# Patient Record
Sex: Male | Born: 1970 | Race: White | Hispanic: No | Marital: Single | Smoking: Never smoker
Health system: Southern US, Community
[De-identification: ages and names within clinical notes are randomized; demographics above are authoritative.]

## PROBLEM LIST (undated history)

## (undated) DIAGNOSIS — E538 Deficiency of other specified B group vitamins: Secondary | ICD-10-CM

## (undated) DIAGNOSIS — N200 Calculus of kidney: Secondary | ICD-10-CM

## (undated) DIAGNOSIS — D61818 Other pancytopenia: Secondary | ICD-10-CM

## (undated) HISTORY — PX: WISDOM TOOTH EXTRACTION: SHX21

## (undated) HISTORY — PX: KIDNEY SURGERY: SHX687

---

## 2015-08-05 ENCOUNTER — Emergency Department (HOSPITAL_COMMUNITY): Payer: Self-pay

## 2015-08-05 ENCOUNTER — Inpatient Hospital Stay (HOSPITAL_COMMUNITY)
Admission: EM | Admit: 2015-08-05 | Discharge: 2015-08-06 | DRG: 809 | Disposition: A | Discharge status: Home / Self Care | Payer: Self-pay | Attending: Internal Medicine | Admitting: Internal Medicine

## 2015-08-05 ENCOUNTER — Encounter (HOSPITAL_COMMUNITY): Payer: Self-pay | Admitting: Emergency Medicine

## 2015-08-05 ENCOUNTER — Inpatient Hospital Stay (HOSPITAL_COMMUNITY): Payer: Self-pay

## 2015-08-05 ENCOUNTER — Other Ambulatory Visit: Payer: Self-pay | Admitting: Oncology

## 2015-08-05 DIAGNOSIS — E538 Deficiency of other specified B group vitamins: Secondary | ICD-10-CM | POA: Diagnosis present

## 2015-08-05 DIAGNOSIS — Z9181 History of falling: Secondary | ICD-10-CM

## 2015-08-05 DIAGNOSIS — R5383 Other fatigue: Secondary | ICD-10-CM | POA: Diagnosis present

## 2015-08-05 DIAGNOSIS — D649 Anemia, unspecified: Secondary | ICD-10-CM | POA: Diagnosis present

## 2015-08-05 DIAGNOSIS — R7989 Other specified abnormal findings of blood chemistry: Secondary | ICD-10-CM | POA: Diagnosis present

## 2015-08-05 DIAGNOSIS — R945 Abnormal results of liver function studies: Secondary | ICD-10-CM | POA: Diagnosis present

## 2015-08-05 DIAGNOSIS — Z87442 Personal history of urinary calculi: Secondary | ICD-10-CM

## 2015-08-05 DIAGNOSIS — E876 Hypokalemia: Secondary | ICD-10-CM | POA: Diagnosis present

## 2015-08-05 DIAGNOSIS — D61818 Other pancytopenia: Principal | ICD-10-CM | POA: Diagnosis present

## 2015-08-05 DIAGNOSIS — R17 Unspecified jaundice: Secondary | ICD-10-CM | POA: Diagnosis present

## 2015-08-05 DIAGNOSIS — D510 Vitamin B12 deficiency anemia due to intrinsic factor deficiency: Secondary | ICD-10-CM | POA: Diagnosis present

## 2015-08-05 DIAGNOSIS — R748 Abnormal levels of other serum enzymes: Secondary | ICD-10-CM | POA: Diagnosis present

## 2015-08-05 DIAGNOSIS — Z833 Family history of diabetes mellitus: Secondary | ICD-10-CM

## 2015-08-05 DIAGNOSIS — D72819 Decreased white blood cell count, unspecified: Secondary | ICD-10-CM

## 2015-08-05 DIAGNOSIS — R531 Weakness: Secondary | ICD-10-CM | POA: Diagnosis present

## 2015-08-05 DIAGNOSIS — Z808 Family history of malignant neoplasm of other organs or systems: Secondary | ICD-10-CM

## 2015-08-05 DIAGNOSIS — Z8249 Family history of ischemic heart disease and other diseases of the circulatory system: Secondary | ICD-10-CM

## 2015-08-05 DIAGNOSIS — R55 Syncope and collapse: Secondary | ICD-10-CM | POA: Diagnosis present

## 2015-08-05 HISTORY — DX: Deficiency of other specified B group vitamins: E53.8

## 2015-08-05 HISTORY — DX: Calculus of kidney: N20.0

## 2015-08-05 HISTORY — DX: Other pancytopenia: D61.818

## 2015-08-05 LAB — URINALYSIS, ROUTINE W REFLEX MICROSCOPIC
GLUCOSE, UA: NEGATIVE mg/dL
Leukocytes, UA: NEGATIVE
Nitrite: NEGATIVE
PH: 6 (ref 5.0–8.0)
Protein, ur: 30 mg/dL — AB
Specific Gravity, Urine: 1.019 (ref 1.005–1.030)
Urobilinogen, UA: 1 mg/dL (ref 0.0–1.0)

## 2015-08-05 LAB — DIFFERENTIAL
Basophils Absolute: 0 10*3/uL (ref 0.0–0.1)
Basophils Relative: 0 %
EOS PCT: 1 %
Eosinophils Absolute: 0 10*3/uL (ref 0.0–0.7)
LYMPHS PCT: 37 %
Lymphs Abs: 0.6 10*3/uL — ABNORMAL LOW (ref 0.7–4.0)
MONOS PCT: 10 %
Monocytes Absolute: 0.2 10*3/uL (ref 0.1–1.0)
NEUTROS ABS: 0.9 10*3/uL — AB (ref 1.7–7.7)
Neutrophils Relative %: 52 %
nRBC: 5 /100 WBC — ABNORMAL HIGH

## 2015-08-05 LAB — CBC
HCT: 14 % — ABNORMAL LOW (ref 39.0–52.0)
Hemoglobin: 5.1 g/dL — CL (ref 13.0–17.0)
MCH: 37.5 pg — AB (ref 26.0–34.0)
MCHC: 36.4 g/dL — ABNORMAL HIGH (ref 30.0–36.0)
MCV: 102.9 fL — AB (ref 78.0–100.0)
PLATELETS: 86 10*3/uL — AB (ref 150–400)
RBC: 1.36 MIL/uL — ABNORMAL LOW (ref 4.22–5.81)
RDW: 14.5 % (ref 11.5–15.5)
WBC: 1.7 10*3/uL — ABNORMAL LOW (ref 4.0–10.5)

## 2015-08-05 LAB — COMPREHENSIVE METABOLIC PANEL
ALT: 29 U/L (ref 17–63)
AST: 74 U/L — AB (ref 15–41)
Albumin: 4.2 g/dL (ref 3.5–5.0)
Alkaline Phosphatase: 209 U/L — ABNORMAL HIGH (ref 38–126)
Anion gap: 10 (ref 5–15)
BUN: 19 mg/dL (ref 6–20)
CO2: 21 mmol/L — AB (ref 22–32)
CREATININE: 0.7 mg/dL (ref 0.61–1.24)
Calcium: 8.6 mg/dL — ABNORMAL LOW (ref 8.9–10.3)
Chloride: 107 mmol/L (ref 101–111)
GFR calc non Af Amer: >60 mL/min (ref >60)
GLUCOSE: 117 mg/dL — AB (ref 65–99)
Potassium: 3.1 mmol/L — ABNORMAL LOW (ref 3.5–5.1)
SODIUM: 138 mmol/L (ref 135–145)
Total Bilirubin: 2.7 mg/dL — ABNORMAL HIGH (ref 0.3–1.2)
Total Protein: 6.1 g/dL — ABNORMAL LOW (ref 6.5–8.1)

## 2015-08-05 LAB — DIC (DISSEMINATED INTRAVASCULAR COAGULATION) PANEL
APTT: 27 s (ref 24–37)
D DIMER QUANT: 3.12 ug{FEU}/mL — AB (ref 0.00–0.48)
FIBRINOGEN: 418 mg/dL (ref 204–475)
INR: 1.18 (ref 0.00–1.49)
PROTHROMBIN TIME: 15.2 s (ref 11.6–15.2)

## 2015-08-05 LAB — VITAMIN B12: Vitamin B-12: <50 pg/mL — ABNORMAL LOW (ref 180–914)

## 2015-08-05 LAB — IRON AND TIBC
Iron: 211 ug/dL — ABNORMAL HIGH (ref 45–182)
SATURATION RATIOS: 85 % — AB (ref 17.9–39.5)
TIBC: 248 ug/dL — ABNORMAL LOW (ref 250–450)
UIBC: 37 ug/dL

## 2015-08-05 LAB — DIC (DISSEMINATED INTRAVASCULAR COAGULATION)PANEL
Platelets: 97 10*3/uL — ABNORMAL LOW (ref 150–400)
Smear Review: NONE SEEN

## 2015-08-05 LAB — URINE MICROSCOPIC-ADD ON

## 2015-08-05 LAB — PATHOLOGIST SMEAR REVIEW

## 2015-08-05 LAB — FERRITIN: Ferritin: 738 ng/mL — ABNORMAL HIGH (ref 24–336)

## 2015-08-05 LAB — ABO/RH: ABO/RH(D): A POS

## 2015-08-05 LAB — RETICULOCYTES
RBC.: 1.48 MIL/uL — AB (ref 4.22–5.81)
RETIC CT PCT: 1.4 % (ref 0.4–3.1)
Retic Count, Absolute: 20.7 10*3/uL (ref 19.0–186.0)

## 2015-08-05 LAB — APTT: APTT: 24 s (ref 24–37)

## 2015-08-05 LAB — SAVE SMEAR

## 2015-08-05 LAB — CBG MONITORING, ED: Glucose-Capillary: 109 mg/dL — ABNORMAL HIGH (ref 65–99)

## 2015-08-05 LAB — POC OCCULT BLOOD, ED: Fecal Occult Bld: NEGATIVE

## 2015-08-05 LAB — PROTIME-INR
INR: 1.17 (ref 0.00–1.49)
Prothrombin Time: 15.1 seconds (ref 11.6–15.2)

## 2015-08-05 LAB — TROPONIN I: Troponin I: <0.03 ng/mL (ref <0.031)

## 2015-08-05 LAB — FOLATE: Folate: 25 ng/mL (ref >5.9)

## 2015-08-05 MED ORDER — SODIUM CHLORIDE 0.9 % IJ SOLN
3.0000 mL | Freq: Two times a day (BID) | INTRAMUSCULAR | Status: DC
  Administered 2015-08-06: 3 mL via INTRAVENOUS

## 2015-08-05 MED ORDER — ACETAMINOPHEN 325 MG PO TABS: 650.0000 mg | ORAL_TABLET | Freq: Four times a day (QID) | ORAL | Status: DC | PRN

## 2015-08-05 MED ORDER — IOHEXOL 300 MG/ML  SOLN
100.0000 mL | Freq: Once | INTRAMUSCULAR | Status: AC | PRN
  Administered 2015-08-05: 100 mL via INTRAVENOUS

## 2015-08-05 MED ORDER — CYANOCOBALAMIN 1000 MCG/ML IJ SOLN
1000.0000 ug | Freq: Once | INTRAMUSCULAR | Status: AC
  Administered 2015-08-05: 1000 ug via INTRAMUSCULAR
  Filled 2015-08-05: qty 1

## 2015-08-05 MED ORDER — SODIUM CHLORIDE 0.9 % IJ SOLN: 3.0000 mL | INTRAMUSCULAR | Status: DC | PRN

## 2015-08-05 MED ORDER — ONDANSETRON HCL 4 MG/2ML IJ SOLN: 4.0000 mg | Freq: Four times a day (QID) | INTRAMUSCULAR | Status: DC | PRN

## 2015-08-05 MED ORDER — SODIUM CHLORIDE 0.9 % IV SOLN: 250.0000 mL | INTRAVENOUS | Status: DC | PRN

## 2015-08-05 MED ORDER — POLYETHYLENE GLYCOL 3350 17 G PO PACK: 17.0000 g | PACK | Freq: Every day | ORAL | Status: DC | PRN

## 2015-08-05 MED ORDER — ACETAMINOPHEN 650 MG RE SUPP: 650.0000 mg | Freq: Four times a day (QID) | RECTAL | Status: DC | PRN

## 2015-08-05 MED ORDER — SODIUM CHLORIDE 0.9 % IV SOLN
Freq: Once | INTRAVENOUS | Status: DC
Start: 2015-08-05 — End: 2015-08-05

## 2015-08-05 MED ORDER — ALUM & MAG HYDROXIDE-SIMETH 200-200-20 MG/5ML PO SUSP: 30.0000 mL | Freq: Four times a day (QID) | ORAL | Status: DC | PRN

## 2015-08-05 MED ORDER — IOHEXOL 300 MG/ML  SOLN
50.0000 mL | Freq: Once | INTRAMUSCULAR | Status: DC | PRN
  Administered 2015-08-05: 50 mL via ORAL
  Filled 2015-08-05: qty 50

## 2015-08-05 MED ORDER — CYANOCOBALAMIN 1000 MCG/ML IJ SOLN
1000.0000 ug | Freq: Once | INTRAMUSCULAR | Status: AC
  Administered 2015-08-06: 1000 ug via INTRAMUSCULAR
  Filled 2015-08-05: qty 1

## 2015-08-05 MED ORDER — ONDANSETRON HCL 4 MG PO TABS: 4.0000 mg | ORAL_TABLET | Freq: Four times a day (QID) | ORAL | Status: DC | PRN

## 2015-08-05 MED ORDER — POTASSIUM CHLORIDE CRYS ER 20 MEQ PO TBCR
40.0000 meq | EXTENDED_RELEASE_TABLET | Freq: Once | ORAL | Status: AC
  Administered 2015-08-05: 40 meq via ORAL
  Filled 2015-08-05: qty 2

## 2015-08-05 MED ORDER — SODIUM CHLORIDE 0.9 % IV SOLN
1000.0000 mL | INTRAVENOUS | Status: DC
  Administered 2015-08-05: 1000 mL via INTRAVENOUS

## 2015-08-05 MED ORDER — SODIUM CHLORIDE 0.9 % IV SOLN: Freq: Once | INTRAVENOUS | Status: DC

## 2015-08-05 MED ORDER — POTASSIUM CHLORIDE CRYS ER 20 MEQ PO TBCR
20.0000 meq | EXTENDED_RELEASE_TABLET | Freq: Two times a day (BID) | ORAL | Status: DC
  Administered 2015-08-06: 20 meq via ORAL
  Filled 2015-08-05 (×2): qty 1

## 2015-08-05 NOTE — Consult Note (Signed)
[place]  Telephone:(336) [place] NOTE  Billy Keith                                MR#: [id number]  DOB: 01/10/1971                        CSN#: [id number]  No care team member to display Referring MD: [facility]    Reason for Consult: Pancytopenia  Billy Keith 44 y.o. [place] male with no prior medical history, admitted via emergency department, after experiencing generalized weakness, shortness of breath, cough, and a syncopal episode. He reports the symptoms appear after a mechanical fall a few weeks prior. He did get seen at a primary physician with these symptoms 2 weeks ago, at which time he was prescribed oral Cipro for possible UTIno chart report is available because this event happened in [place].  He was found to have abnormal CBC from today, remarkable for a white count of 1.7, hemoglobin of 5.1, MCV 102.9, and platelets were 86,000. Reticulocyte count is low normal 20.7. He is to receive 2 units of blood to improve his hemoglobin count. Of note his bilirubin was elevated at 2.7. AST 74.  He reports intermittent chills, subjective fevers, but denies night sweats. He reports some nausea without oomiting or diarrhea. He has decreased appetite, with mild weight loss.  He did report darker urine for at least 2 weeks.He denies recent bruising or acute bleeding, such as spontaneous epistaxis, hematuria, melena or hematochezia or gum bleeding. Of note, [place], and he has had exposure to ticks in the past. He states that his dogs had ticks about a week ago, he is not dure of any ticks on him however. In the past, a PCP had mentioned he may have had lyme disease, but this was never tested. The patient denies history of liver disease, risk factors for HIV. He denies any alcohol or tobacco use. Denies exposure to heparin, Lovenox. Denies any history of cardiac murmur or prior cardiovascular surgery. Denies recent new  medications, ASA or NSAIDs.He denies prior blood or platelet transfusions. Patient never had a hematological evaluation prior to this admission. He never had a bone marrow biopsy. He denies any exposure to infected individuals or long-distance travel.   Medications include  His CBC was Currently, his DIC, B-12, ironpanel, reticulocytes, pro times, HIV, Hemoccult, and smear are pending  We were kindly requested to see the patient in consultation with recommendations  PMH:  Past Medical History  Diagnosis Date  . Kidney stones    Surgeries  History reviewed. No pertinent past surgical history.  Allergies: No Known Allergies  Medications:    prior to admission:  (Not in a hospital admission)     DQQ:IWLNLGX  ROS: Constitutional: Subjective fevers,  denies chills or abnormal night sweats Eyes: Denies blurriness of vision, double vision or watery eyes Ears, nose, mouth, throat, and face: Denies mucositis or sore throat Respiratory: Intermittent cough, dyspnea or wheezes Cardiovascular: Denies palpitation, chest discomfort or lower extremity swelling Gastrointestinal:  Intermittent nausea, or heartburn. Denies change in bowel habits GU: reports tea colored urine Skin: Denies abnormal skin rashes Lymphatics: Denies new lymphadenopathy or easy bruising Neurological:Denies numbness, tingling or new weaknesses Behavioral/Psych: Mood is stable, no new changes  All other systems were reviewed with the patient and are negative.  Family History:   Mother died with brain cancer. Father alive with heart disease. 1 brother in good health.  Social History:  reports that he has never smoked. He does not have any smokeless tobacco history on file. He reports that he does not drink alcohol or use illicit drugs.  Originally from [place]. He lives in [place]. Currently not working. [place].   Physical Exam    ECOG PERFORMANCE STATUS:1  Filed Vitals:   08/05/15 1145    BP: 114/76  Pulse: 113  Temp:   Resp: 16   There were no vitals filed for this visit.  GENERAL:alert, no distress and comfortable. Disheveled appearance SKIN: skin color pale, texture, turgor are pale, no rashes or significant lesions EYES: normal, conjunctiva are pink and non-injected, sclera clear OROPHARYNX:no exudate, no erythema and lips, buccal mucosa, and tongue normal  NECK: supple, thyroid normal size, non-tender, without nodularity LYMPH:  no palpable lymphadenopathy in the cervical, axillary or inguinal area LUNGS: clear to auscultation and percussion with normal breathing effort HEART: regular rate & rhythm and no murmurs and no lower extremity edema ABDOMEN: soft, non-tender and normal bowel sounds Musculoskeletal:no cyanosis of digits and no clubbing  PSYCH: alert & oriented x 3 with fluent speech NEURO: no focal motor/sensory deficits   Labs:    Recent Labs Lab 08/05/15 0927  WBC 1.7*  HGB 5.1*  HCT 14.0*  PLT 86*  MCV 102.9*  MCH 37.5*  MCHC 36.4*  RDW 14.5        Recent Labs Lab 08/05/15 0927  NA 138  K 3.1*  CL 107  CO2 21*  GLUCOSE 117*  BUN 19  CREATININE 0.70  CALCIUM 8.6*  AST 74*  ALT 29  ALKPHOS 209*  BILITOT 2.7*        Component Value Date/Time   BILITOT 2.7* 08/05/2015 0927      Recent Labs Lab 08/05/15 0927  INR 1.17    No results for input(s): DDIMER in the last 72 hours.   Anemia panel:   Recent Labs  08/05/15 1131  RETICCTPCT 1.4    Urinalysis    Component Value Date/Time   COLORURINE AMBER* 08/05/2015 1100   APPEARANCEUR CLEAR 08/05/2015 1100   LABSPEC 1.019 08/05/2015 1100   PHURINE 6.0 08/05/2015 1100   GLUCOSEU NEGATIVE 08/05/2015 1100   HGBUR TRACE* 08/05/2015 1100   BILIRUBINUR MODERATE* 08/05/2015 1100   KETONESUR >80* 08/05/2015 1100   PROTEINUR 30* 08/05/2015 1100   UROBILINOGEN 1.0 08/05/2015 1100   NITRITE NEGATIVE 08/05/2015 1100   LEUKOCYTESUR NEGATIVE 08/05/2015 1100     Drugs of Abuse  No results found for: LABOPIA, COCAINSCRNUR, LABBENZ, AMPHETMU, THCU, LABBARB   Imaging Studies:  Dg Chest 2 View  08/05/2015  CLINICAL DATA:  Ongoing cough, congestion, shortness of breath. EXAM: CHEST  2 VIEW COMPARISON:  None. FINDINGS: Linear densities anteriorly on the lateral view, likely lingular scarring or atelectasis. Right lung is clear. No effusions. Heart is borderline in size. No acute bony abnormality. IMPRESSION: Lingular scarring.  No active disease. Electronically Signed   By: Rolm Baptise M.D.   On: 08/05/2015 09:50     A/P: 44 y.o. male with :  Pancytopenia At this time the etiology is unknown. Will obtain a smear to rule out any bone marrow process . Will await for results.    Thrombocytopenia: Anemia At this point the cause is unknown. Of note, patient has been recently treated with antibiotics,which may affect his bone marrow. In  addition, he [place], unknown if he had any recent ticks or insect bites No bleeding issues are reported Consider transfusion of platelets it they are less than 10,000 or acutely bleeding and transfusion of RBC to keep Hb greater than 8 No heparin or heprin products are recommended at this time Check platelets daily using citrate tube. Iron panel, including B12 is pending. Consider Hepatitis panel.  Agree with CT abdomen and pelvis to rule out hepatosplenomegaly versus other abnormalities. Will order a peripheral blood smear to rule out schistocytes.  His DIC, B-12, iron panel, reticulocytes, protimes, HIV, Hemoccult, and smear are pending. Consider fractionated Bilirubin, and RMSF.  Leukopenia Could be acute.  Will check smear to rule out leukemoid issues. Repeat CBC with differential in am  Will monitor  Full Code    If above workup negative, will follow up after his infection resolves, and consider bone marrow biopsy if his anemia and thrombocytopenia still persists. Will hold off biopsy for  now.  Rondel Jumbo, PA-C 08/05/2015 12:02 PM   ADDENDUM: FAMILY HISTORY: The patient's father is 54 y/o, was in Museum/gallery curator. The patient's mother died from brain cancer age 65, The patient has one brother no sisters. There is no other history of cancer or blood disorders in the family.  SOCIAL HISTORY: Mr Stupka lives in [place], [place] ([place] alone w 2 dogs. 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 He keeps to a vegan diet-- no eggs, no dairy, no meat. He has been on this diet since age 42.    Labwork shows a B-12 level below the limit of measurement. Ferritin and folate are adequate. There is a mild/moderate elevate of the AST but not ALT in this patient who is not a drinker or drug user. He did however recently complete a course of antibiotics.  IMPRESSION: 44 y/o [place], [place] man with severe B-12 deficiency, either due to a vegan diet, or to inability to absorb B-12, or both  PLAN: We will start B-12 supplementation tonight. We will start potassium supplementation at the same time as severe hypokalemia can occur with rapid uptake of K+ by the marrow when is starts making cells. We will check anti-IF antibodies in AM. He will receive a 2d dose of B-12 in AM and from my point of view can be discharged after that.   Please coordinate care with his PCP, Dr Linus Orn in [place], [place] so the patient can receive 1000 mcg B-12 weekly x4, then monthly. Potassium does not need to be continued after tomorrow's AM dose.   I will let Dr Linus Orn know results of antibody tests drawn tomorrow and will follow patient's countsto normalization through his office.  Please let me know if I can be of further help.  I personally saw this patient and performed a substantive portion of this encounter with the listed APP documented above.   Chauncey Cruel, MD Medical Oncology and Hematology [facility] [address] [place], [place] [address] Tel.  [phone]    Fax. [phone]

## 2015-08-05 NOTE — H&P (Signed)
History and Physical:    Billy Keith   WUJ:[id number] DOB: [birth year] DOA: 08/05/2015  Referring MD/provider: Dr. Linwood Dibbles PCP: Pcp Not In System Dr. Kennith Center, [facility] in [place]  Chief Complaint: Generalized weakness, shortness of breath, syncope  History of Present Illness:   Billy Keith is an 44 y.o. male with no prior medical history, who presented to the ED with a chief complaint of a 2 week history of generalized weakness, shortness of breath, cough, and a syncopal episode. He reports the symptoms initially appeared after a mechanical fall a few weeks prior. He was seen by Dr. Kennith Center with these symptoms 2 weeks ago, and was put empirically on Cipro for possible UTI because he reported having dark colored urine.  No blood work was done at that time, and no urine was sent due to his inability to provide a sample.  He has taken Cipro but has not had any resolution of his symptoms.  He became concerned because of dyspnea, racing heart and dizziness with activity so he came to the ED for further evaluaion.  Labs done on admission showed a WBC of 1.7, hemoglobin of 5.1, MCV 102.9, and platelets 86,000. Reticulocyte count is low normal 20.7, bilirubin 2.7. The patient denies history of liver disease and risk factors for HIV. Denies any ticks or other insect bites. He denies any exposure to infected individuals or long-distance travel.    ROS:   Review of Systems  Constitutional: Positive for chills, weight loss and malaise/fatigue. Negative for fever.  HENT: Positive for congestion. Negative for nosebleeds and sore throat.   Eyes: Negative.   Respiratory: Positive for cough, sputum production and shortness of breath.   Cardiovascular: Positive for palpitations. Negative for chest pain.  Gastrointestinal: Positive for heartburn, nausea and vomiting. Negative for diarrhea, constipation, blood in stool and melena.  Genitourinary:       Very dark urine.  Musculoskeletal: Positive for  falls.  Skin: Negative.   Neurological: Positive for dizziness, loss of consciousness and weakness. Negative for headaches.  Endo/Heme/Allergies: Negative for environmental allergies and polydipsia. Does not bruise/bleed easily.     Past Medical History:   Past Medical History  Diagnosis Date  . Kidney stones     Past Surgical History:   Past Surgical History  Procedure Laterality Date  . Kidney surgery      WHEN HE WAS A CHILD  . Wisdom tooth extraction      Social History:   Social History   Social History  . Marital Status: Single    Spouse Name: N/A  . Number of Children: 0  . Years of Education: N/A   Occupational History  . Unemployed.    Social History Main Topics  . Smoking status: Never Smoker   . Smokeless tobacco: Never Used  . Alcohol Use: No  . Drug Use: No  . Sexual Activity: No   Other Topics Concern  . Not on file   Social History Narrative   Single.  Lives alone with 2 dogs and a cat.    Family history:   Family History  Problem Relation Age of Onset  . Diabetes Mother   . Brain cancer Mother     Died in late 64s  . Heart attack Father     Allergies   Review of patient's allergies indicates no known allergies.  Current Medications:   Prior to Admission medications   Medication Sig Start Date End Date Taking? Authorizing Provider  ciprofloxacin (CIPRO)  500 MG tablet Take 500 mg by mouth 2 (two) times daily.   Yes Historical Provider, MD  ibuprofen (ADVIL,MOTRIN) 200 MG tablet Take 400 mg by mouth every 6 (six) hours as needed for headache, mild pain or moderate pain.   Yes Historical Provider, MD    Physical Exam:   Filed Vitals:   08/05/15 1308 08/05/15 1328 08/05/15 1336 08/05/15 1352  BP:  120/65 123/66 127/65  Pulse:  118 118 115  Temp:  98.9 F (37.2 C) 98.9 F (37.2 C) 99.2 F (37.3 C)  TempSrc:  Oral Oral Oral  Resp:  Height:  (1.702 m)     Weight: 104.3 kg (229 lb 15 oz)     SpO2:  98% 96%  100%     Physical Exam: Blood pressure 127/65, pulse 115, temperature 99.2 F (37.3 C), temperature source Oral, resp. rate 16, height  (1.702 m), weight 104.3 kg (229 lb 15 oz), SpO2 100 %. Gen: No acute distress. Pale appearing. Head: Normocephalic, atraumatic. Eyes: PERRL, EOMI, sclerae nonicteric. Mouth: Oropharynx clear. Neck: Supple, no thyromegaly, no lymphadenopathy, no jugular venous distention. Chest: Lungs clear to auscultation bilaterally. CV: Heart sounds are tachycardic/regular. No murmurs, rubs, or gallops. Abdomen: Soft, nontender, nondistended with normal active bowel sounds. Rectal: Deferred. Done by EDP. Hemoccult negative. Extremities: Extremities without clubbing, edema, or cyanosis. Skin: Warm and dry. Neuro: Alert and oriented times 3, nonfocal. Psych: Mood and affect normal.   Data Review:    Labs: Basic Metabolic Panel:  Recent Labs Lab 08/05/15 0927  NA 138  K 3.1*  CL 107  CO2 21*  GLUCOSE 117*  BUN 19  CREATININE 0.70  CALCIUM 8.6*   Liver Function Tests:  Recent Labs Lab 08/05/15 0927  AST 74*  ALT 29  ALKPHOS 209*  BILITOT 2.7*  PROT 6.1*  ALBUMIN 4.2   CBC:  Recent Labs Lab 08/05/15 0927 08/05/15 1131  WBC 1.7*  --   NEUTROABS 0.9*  --   HGB 5.1*  --   HCT 14.0*  --   MCV 102.9*  --   PLT 86* 97*   Cardiac Enzymes:  Recent Labs Lab 08/05/15 0927  TROPONINI <0.03   CBG:  Recent Labs Lab 08/05/15 0911  GLUCAP 109*    Radiographic Studies: Dg Chest 2 View  08/05/2015  CLINICAL DATA:  Ongoing cough, congestion, shortness of breath. EXAM: CHEST  2 VIEW COMPARISON:  None. FINDINGS: Linear densities anteriorly on the lateral view, likely lingular scarring or atelectasis. Right lung is clear. No effusions. Heart is borderline in size. No acute bony abnormality. IMPRESSION: Lingular scarring.  No active disease. Electronically Signed   By: Charlett Nose M.D.   On: 08/05/2015 09:50   Ct Abdomen Pelvis W  Contrast  08/05/2015  CLINICAL DATA:  Nausea, chills and discolored urine developing over the past 4 weeks since a fall. Shortness of breath. Pancytopenia. EXAM: CT ABDOMEN AND PELVIS WITH CONTRAST TECHNIQUE: Multidetector CT imaging of the abdomen and pelvis was performed using the standard protocol following bolus administration of intravenous contrast. CONTRAST:  100 mL OMNIPAQUE IOHEXOL 300 MG/ML  SOLN COMPARISON:  None. FINDINGS: The lung bases are clear.  No pleural or pericardial effusion. The gallbladder, liver, adrenal glands, pancreas and left kidney are unremarkable. Tiny cyst lower pole right kidney is noted. The stomach, small and large bowel and appendix appear normal. A 0.9 cm gastrohepatic ligament lymph node is identified. This is likely incidental as no pathologically  enlarged lymph nodes are seen. No focal bony abnormality. IMPRESSION: No acute abnormality or finding to explain the patient's symptoms. Single 0.9 cm gastrohepatic ligament lymph node is likely incidental. Electronically Signed   By: Drusilla Kannerhomas  Dalessio M.D.   On: 08/05/2015 13:30   EKG: Independently reviewed. Sinus tachycardia at 118 bpm. Prolonged QTc interval.   Assessment/Plan:   Principal Problem:   Pancytopenia (HCC)/symptomatic anemia/syncope - The differential diagnosis of pancytopenia without prominent splenomegaly includes other congenital and acquired causes of bone marrow failure, including the acute leukemias, large granular lymphocyte leukemia, the myelodysplastic syndromes (MDS, especially the hypocellular variant), marrow replacement by fibrosis or tumor, severe megaloblastic anemia, paroxysmal nocturnal hemoglobinuria (PNH), and overwhelming infection due to HIV or the viral hemophagocytic syndrome. - Patient is not sexually active and denies HIV risk factors. Follow-up HIV serologies. - Hematology/oncology consultation requested. Send smear for pathologist/oncologist review. - He will likely need a bone  marrow biopsy.  - Follow-up anemia panel. DIC panel negative. D-dimer slightly elevated at 3.12. Fibrinogen 418. INR 1.18. - Check RMSF titers. - Receiving 2 units of PRBCs for symptomatic anemia. No current indication for platelet transfusion.  Active Problems:   Hypokalemia - Replete.    Abnormal LFTs - Check haptoglobin (rule out hemolysis) and hepatitis panel.  DVT prophylaxis - SCDs ordered.  Code Status / Family Communication / Disposition Plan:   Code Status: Full. Family Communication: No family at bedside. Disposition Plan: Home when evaluation complete. Lives alone.  Attestation regarding necessity of inpatient status:   The appropriate admission status for this patient is INPATIENT. Inpatient status is judged to be reasonable and necessary in order to provide the required intensity of service to ensure the patient's safety. The patient's presenting symptoms, physical exam findings, and initial radiographic and laboratory data in the context of their chronic comorbidities is felt to place them at high risk for further clinical deterioration. Furthermore, it is not anticipated that the patient will be medically stable for discharge from the hospital within 2 midnights of admission. The following factors support the admission status of inpatient.   -The patient's presenting symptoms include dizziness, weakness, syncope, shortness of breath. - The worrisome physical exam findings include pale skin. - The initial radiographic and laboratory data are worrisome because of severe pancytopenia of undetermined etiology. - Patient requires inpatient status due to high intensity of service, high risk for further deterioration and high frequency of surveillance required. - I certify that at the point of admission it is my clinical judgment that the patient will require inpatient hospital care spanning beyond 2 midnights from the point of admission.   Time spent: 70  minutes.  RAMA,CHRISTINA [facility] Pager [phone] Cell: [phone]   If 7PM-7AM, please contact night-coverage [email or web address] Password [email or web address] 08/05/2015, 3:26 PM

## 2015-08-05 NOTE — ED Notes (Signed)
Patient transported to CT 

## 2015-08-05 NOTE — ED Notes (Signed)
Pt in CT, will transport to floor once pt returns.

## 2015-08-05 NOTE — ED Provider Notes (Signed)
CSN: [id number]     Arrival date & time 08/05/15  [id number] History   First MD Initiated Contact with Patient 08/05/15 0902     Chief Complaint  Patient presents with  . Loss of Consciousness   HPI Pt states he slipped and fell 4 weeks ago and injured his ribs.  He did not get evaluated after that happened.  Over the next several weeks he developed exertional shortness of breath and fatigue.  He would get lightheaded and short of breath even when walking to the mailbox.  He would have to stop several times.  He went to a doctor 2 weeks after it happened.  He was given a prescription for abx, Cipro? for possible infection.  Today he had trouble even walking to his Dads car.   He had to call EMS.  They noticed a syncopal episode.  He has noticed dark urine.  He has looked pale.  He continues to cough.  He has felt nauseated.  He has felt chilled.  No measured fever.  No weight loss.  Decreased appetite.  No foreign travel.  No blood in stool or dark stools.   Past Medical History  Diagnosis Date  . Kidney stones    History reviewed. No pertinent past surgical history. History reviewed. No pertinent family history. Social History  Substance Use Topics  . Smoking status: Never Smoker   . Smokeless tobacco: None  . Alcohol Use: No    Review of Systems  Constitutional: Positive for fatigue.  Respiratory: Positive for cough and shortness of breath.   Genitourinary:       Dark urine   All other systems reviewed and are negative.     Allergies  Review of patient's allergies indicates no known allergies.  Home Medications   Prior to Admission medications   Medication Sig Start Date End Date Taking? Authorizing Provider  ciprofloxacin (CIPRO) 500 MG tablet Take 500 mg by mouth 2 (two) times daily.   Yes Historical Provider, MD  ibuprofen (ADVIL,MOTRIN) 200 MG tablet Take 400 mg by mouth every 6 (six) hours as needed for headache, mild pain or moderate pain.   Yes Historical Provider, MD    BP 126/86 mmHg  Pulse 111  Temp(Src) 98.3 F (36.8 C) (Oral)  Resp 21  SpO2 100% Physical Exam  Constitutional: No distress.  HENT:  Head: Normocephalic and atraumatic.  Right Ear: External ear normal.  Left Ear: External ear normal.  Eyes: Conjunctivae are normal. Right eye exhibits no discharge. Left eye exhibits no discharge. No scleral icterus.  Neck: Neck supple. No tracheal deviation present.  Cardiovascular: Regular rhythm and intact distal pulses.  Tachycardia present.   Pulmonary/Chest: Effort normal and breath sounds normal. No stridor. No respiratory distress. He has no wheezes. He has no rales. He exhibits tenderness (mild anterior chest, no bruising).  Abdominal: Soft. Bowel sounds are normal. He exhibits no distension. There is no tenderness. There is no rebound and no guarding.  Musculoskeletal: He exhibits no edema or tenderness.  Neurological: He is alert. He has normal strength. No cranial nerve deficit (no facial droop, extraocular movements intact, no slurred speech) or sensory deficit. He exhibits normal muscle tone. He displays no seizure activity. Coordination normal.  Skin: Skin is warm and dry. No rash noted. He is not diaphoretic. There is pallor.  Psychiatric: He has a normal mood and affect.  Nursing note and vitals reviewed.   ED Course  Procedures  CRITICAL CARE Performed by: FAOZH,[email or web address],Brandii Lakey Total critical  care time: 35 minutes Critical care time was exclusive of separately billable procedures and treating other patients. Critical care was necessary to treat or prevent imminent or life-threatening deterioration. Critical care was time spent personally by me on the following activities: development of treatment plan with patient and/or surrogate as well as nursing, discussions with consultants, evaluation of patient's response to treatment, examination of patient, obtaining history from patient or surrogate, ordering and performing treatments and  interventions, ordering and review of laboratory studies, ordering and review of radiographic studies, pulse oximetry and re-evaluation of patient's condition.  Labs Review Labs Reviewed  CBC - Abnormal; Notable for the following:    WBC 1.7 (*)    RBC 1.36 (*)    Hemoglobin 5.1 (*)    HCT 14.0 (*)    MCV 102.9 (*)    MCH 37.5 (*)    MCHC 36.4 (*)    Platelets 86 (*)    All other components within normal limits  COMPREHENSIVE METABOLIC PANEL - Abnormal; Notable for the following:    Potassium 3.1 (*)    CO2 21 (*)    Glucose, Bld 117 (*)    Calcium 8.6 (*)    Total Protein 6.1 (*)    AST 74 (*)    Alkaline Phosphatase 209 (*)    Total Bilirubin 2.7 (*)    All other components within normal limits  CBG MONITORING, ED - Abnormal; Notable for the following:    Glucose-Capillary 109 (*)    All other components within normal limits  APTT  PROTIME-INR  TROPONIN I  URINALYSIS, ROUTINE W REFLEX MICROSCOPIC (NOT AT [facility])  HIV ANTIBODY (ROUTINE TESTING)  DIC (DISSEMINATED INTRAVASCULAR COAGULATION) PANEL  VITAMIN B12  FOLATE  IRON AND TIBC  FERRITIN  RETICULOCYTES  POC OCCULT BLOOD, ED  TYPE AND SCREEN    Imaging Review Dg Chest 2 View  08/05/2015  CLINICAL DATA:  Ongoing cough, congestion, shortness of breath. EXAM: CHEST  2 VIEW COMPARISON:  None. FINDINGS: Linear densities anteriorly on the lateral view, likely lingular scarring or atelectasis. Right lung is clear. No effusions. Heart is borderline in size. No acute bony abnormality. IMPRESSION: Lingular scarring.  No active disease. Electronically Signed   By: Charlett Nose M.D.   On: 08/05/2015 09:50   I have personally reviewed and evaluated these images and lab results as part of my medical decision-making.   EKG Interpretation   Date/Time:  Monday August 05 2015 09:12:05 EST Ventricular Rate:  118 PR Interval:  127 QRS Duration: 97 QT Interval:  433 QTC Calculation: 607 R Axis:   53 Text Interpretation:  Sinus  tachycardia Borderline repolarization  abnormality Prolonged QT interval No old tracing to compare Confirmed by  Latoria Dry  MD-J, Janylah Belgrave ([id number]) on 08/05/2015 9:18:19 AM      MDM   Final diagnoses:  Pancytopenia (HCC)  Syncope, unspecified syncope type  Elevated bilirubin    Patient's laboratory shows he is a pancytopenia.  This accounts for his weakness and syncope. The patient's hemoglobin is 5.1 and he will require blood transfusions.  Stool guaiac was performed.  ?acute aplastic anemia.  Will add on dic and anemia panel.  Patient does have an elevated bilirubin as well as alkaline phosphatase and AST.  Plan on CT scan of the abdomen to evaluate for possible malignancy or mass.  I will consult with hospitalist service for admission. Consult with hematology and oncology.    Linwood Dibbles, MD 08/05/15 1150

## 2015-08-05 NOTE — ED Notes (Signed)
Bed: WA21 Expected date:  Expected time:  Means of arrival:  Comments: EMS  

## 2015-08-05 NOTE — ED Notes (Signed)
Pt states that he slipped and fell about a month ago.  Was walking down a ramp with frost on it, lost his footing and hit the ramp straight across his chest.  States that since then, he has had exertional sob, dry heaving, orthostatic, and heart racing.  Pt passed out for about 15 seconds with EMS.  Was orthostatic.  Pt is extremely.  Does not drink, smoke or do drugs.

## 2015-08-05 NOTE — ED Notes (Signed)
Patient transported to X-ray 

## 2015-08-06 ENCOUNTER — Encounter (HOSPITAL_COMMUNITY): Payer: Self-pay | Admitting: Internal Medicine

## 2015-08-06 DIAGNOSIS — E538 Deficiency of other specified B group vitamins: Secondary | ICD-10-CM | POA: Diagnosis present

## 2015-08-06 HISTORY — DX: Deficiency of other specified B group vitamins: E53.8

## 2015-08-06 LAB — CBC WITH DIFFERENTIAL/PLATELET
BASOS ABS: 0 10*3/uL (ref 0.0–0.1)
BASOS PCT: 1 %
EOS PCT: 1 %
Eosinophils Absolute: 0 10*3/uL (ref 0.0–0.7)
HEMATOCRIT: 20.4 % — AB (ref 39.0–52.0)
HEMOGLOBIN: 7.4 g/dL — AB (ref 13.0–17.0)
LYMPHS ABS: 0.8 10*3/uL (ref 0.7–4.0)
Lymphocytes Relative: 36 %
MCH: 33.8 pg (ref 26.0–34.0)
MCHC: 36.3 g/dL — AB (ref 30.0–36.0)
MCV: 93.2 fL (ref 78.0–100.0)
MONO ABS: 0.2 10*3/uL (ref 0.1–1.0)
Monocytes Relative: 10 %
NEUTROS PCT: 52 %
NRBC: 4 /100{WBCs} — AB
Neutro Abs: 1.1 10*3/uL — ABNORMAL LOW (ref 1.7–7.7)
Platelets: 74 10*3/uL — ABNORMAL LOW (ref 150–400)
RBC: 2.19 MIL/uL — ABNORMAL LOW (ref 4.22–5.81)
RDW: 21.8 % — ABNORMAL HIGH (ref 11.5–15.5)
WBC: 2.1 10*3/uL — ABNORMAL LOW (ref 4.0–10.5)

## 2015-08-06 LAB — TYPE AND SCREEN
ABO/RH(D): A POS
Antibody Screen: NEGATIVE
UNIT DIVISION: 0
UNIT DIVISION: 0

## 2015-08-06 LAB — HIV ANTIBODY (ROUTINE TESTING W REFLEX): HIV Screen 4th Generation wRfx: NONREACTIVE

## 2015-08-06 MED ORDER — CYANOCOBALAMIN 1000 MCG/ML IJ SOLN: 1000.0000 ug | INTRAMUSCULAR | Status: AC

## 2015-08-06 MED ORDER — CYANOCOBALAMIN 1000 MCG/ML IJ SOLN: 1000.0000 ug | Freq: Once | INTRAMUSCULAR | Status: DC

## 2015-08-06 NOTE — Discharge Summary (Addendum)
Physician Discharge Summary  Billy Keith ZOX:[id number] DOB: 1971-06-10 DOA: 08/05/2015  PCP: Dr. Donnamarie RossettiJohn Tracy, [place]  Admit date: 08/05/2015 Discharge date: 08/06/2015   Recommendations for Outpatient Follow-Up:   1. Will need weekly B12 injections x 4 then monthly.  Prescription given, patient instructed to bring B12 injection to his MD's appointment 08/13/15.  PCP will need to follow up on intrinsic factor and anti-parietal antibody testing.  Other outstanding tests: RMSF titers, haptoglobin and hepatitis panel.   Discharge Diagnosis:   Principal Problem:    Pancytopenia (HCC) in the setting of B12 deficiency rule out pernicious anemia Active Problems:    Hypokalemia    Abnormal LFTs including elevated bilirubin, elevated AST and elevated alkaline phosphatase    Symptomatic anemia    Syncope    Vitamin B 12 deficiency rule out pernicious anemia   Discharge disposition:  Home.    Discharge Condition: Improved.  Diet recommendation: Increase B12 intake with vitamin supplement or fortified cereal (patient is vegan).   History of Present Illness:   Billy Keith is an 44 y.o. male with no prior medical history, who presented to the ED with a chief complaint of a 2 week history of generalized weakness, shortness of breath, cough, and a syncopal episode. He reports the symptoms initially appeared after a mechanical fall a few weeks prior. He was seen by Dr. French Anaracy with these symptoms 2 weeks ago, and was put empirically on Cipro for possible UTI because he reported having dark colored urine. No blood work was done at that time, and no urine was sent due to his inability to provide a sample. He has taken Cipro but has not had any resolution of his symptoms. He became concerned because of dyspnea, racing heart and dizziness with activity so he came to the ED for further evaluaion. Labs done on admission showed a WBC of 1.7, hemoglobin of 5.1, MCV 102.9, and platelets  86,000. Reticulocyte count is low normal 20.7, bilirubin 2.7. The patient denies history of liver disease and risk factors for HIV. Denies any ticks or other insect bites. He denies any exposure to infected individuals or long-distance travel.    Hospital Course by Problem:   Principal Problem:  Pancytopenia (HCC)/symptomatic anemia/syncope secondary to suspected pernicious anemia - The differential diagnosis of pancytopenia without prominent splenomegaly includes other congenital and acquired causes of bone marrow failure, including the acute leukemias, large granular lymphocyte leukemia, the myelodysplastic syndromes (MDS, especially the hypocellular variant), marrow replacement by fibrosis or tumor, severe megaloblastic anemia, paroxysmal nocturnal hemoglobinuria (PNH), and overwhelming infection due to HIV or the viral hemophagocytic syndrome. - Patient is not sexually active and denies HIV risk factors. HIV PCR negative. - Anemia panel: Folate WNL, ferritin 738, iron 211, B-12 less than 50. Started on B 12 supplementation. Follow-up anti-IF and anti-parietal antibodies.  Will need weekly B12 injections x 4 then monthly. - DIC panel negative. D-dimer slightly elevated at 3.12. Fibrinogen 418. INR 1.18. - Follow-up RMSF titers. - Status post 2 units of PRBCs for symptomatic anemia. D/C hemoglobin 7.4. - No current indication for platelet transfusion.  Active Problems:  Hypokalemia - Repleted.     Abnormal LFTs - Follow-up haptoglobin (rule out hemolysis) and hepatitis panel.  Medical Consultants:    Dr. Ruthann CancerGustav Magrinat, Hematology   Discharge Exam:   Filed Vitals:   08/06/15 0445  BP: 122/60  Pulse: 96  Temp: 98.5 F (36.9 C)  Resp: 18   Filed Vitals:   08/05/15 1635  08/05/15 1839 08/05/15 2116 08/06/15 0445  BP: 106/92 111/89 90/64 122/60  Pulse: 115 105 102 96  Temp: 98.9 F (37.2 C) 99.1 F (37.3 C) 99 F (37.2 C) 98.5 F (36.9 C)  TempSrc: Oral Oral Oral Oral    Resp: Height:      Weight:      SpO2: 100% 94% 98% 99%    Gen:  NAD, pale Cardiovascular: Mildly tachy, No M/R/G Respiratory: Lungs CTAB Gastrointestinal: Abdomen soft, NT/ND with normal active bowel sounds. Extremities: No C/E/C   The results of significant diagnostics from this hospitalization (including imaging, microbiology, ancillary and laboratory) are listed below for reference.     Procedures and Diagnostic Studies:   Dg Chest 2 View  08/05/2015  CLINICAL DATA:  Ongoing cough, congestion, shortness of breath. EXAM: CHEST  2 VIEW COMPARISON:  None. FINDINGS: Linear densities anteriorly on the lateral view, likely lingular scarring or atelectasis. Right lung is clear. No effusions. Heart is borderline in size. No acute bony abnormality. IMPRESSION: Lingular scarring.  No active disease. Electronically Signed   By: Charlett Nose M.D.   On: 08/05/2015 09:50   Ct Abdomen Pelvis W Contrast  08/05/2015  CLINICAL DATA:  Nausea, chills and discolored urine developing over the past 4 weeks since a fall. Shortness of breath. Pancytopenia. EXAM: CT ABDOMEN AND PELVIS WITH CONTRAST TECHNIQUE: Multidetector CT imaging of the abdomen and pelvis was performed using the standard protocol following bolus administration of intravenous contrast. CONTRAST:  100 mL OMNIPAQUE IOHEXOL 300 MG/ML  SOLN COMPARISON:  None. FINDINGS: The lung bases are clear.  No pleural or pericardial effusion. The gallbladder, liver, adrenal glands, pancreas and left kidney are unremarkable. Tiny cyst lower pole right kidney is noted. The stomach, small and large bowel and appendix appear normal. A 0.9 cm gastrohepatic ligament lymph node is identified. This is likely incidental as no pathologically enlarged lymph nodes are seen. No focal bony abnormality. IMPRESSION: No acute abnormality or finding to explain the patient's symptoms. Single 0.9 cm gastrohepatic ligament lymph node is likely incidental.  Electronically Signed   By: Drusilla Kanner M.D.   On: 08/05/2015 13:30     Labs:   Basic Metabolic Panel:  Recent Labs Lab 08/05/15 0927  NA 138  K 3.1*  CL 107  CO2 21*  GLUCOSE 117*  BUN 19  CREATININE 0.70  CALCIUM 8.6*   GFR Estimated Creatinine Clearance: 132.8 mL/min (by C-G formula based on Cr of 0.7). Liver Function Tests:  Recent Labs Lab 08/05/15 0927  AST 74*  ALT 29  ALKPHOS 209*  BILITOT 2.7*  PROT 6.1*  ALBUMIN 4.2   Coagulation profile  Recent Labs Lab 08/05/15 0927 08/05/15 1131  INR 1.17 1.18    CBC:  Recent Labs Lab 08/05/15 0927 08/05/15 1131 08/06/15 0550  WBC 1.7*  --  2.1*  NEUTROABS 0.9*  --  1.1*  HGB 5.1*  --  7.4*  HCT 14.0*  --  20.4*  MCV 102.9*  --  93.2  PLT 86* 97* 74*   Cardiac Enzymes:  Recent Labs Lab 08/05/15 0927  TROPONINI <0.03   BNP: Invalid input(s): POCBNP CBG:  Recent Labs Lab 08/05/15 0911  GLUCAP 109*   D-Dimer  Recent Labs  08/05/15 1131  DDIMER 3.12*   Anemia work up  Recent Labs  08/05/15 1131  VITAMINB12 <50*  FOLATE 25.0  FERRITIN 738*  TIBC 248*  IRON 211*  RETICCTPCT 1.4   Microbiology No  results found for this or any previous visit (from the past 240 hour(s)).   Discharge Instructions:   Discharge Instructions    Call MD for:  extreme fatigue    Complete by:  As directed      Call MD for:  persistant dizziness or light-headedness    Complete by:  As directed      Call MD for:  temperature >100.4    Complete by:  As directed      Diet general    Complete by:  As directed      Increase activity slowly    Complete by:  As directed             Medication List    STOP taking these medications        ciprofloxacin 500 MG tablet  Commonly known as:  CIPRO      TAKE these medications        cyanocobalamin 1000 MCG/ML injection  Commonly known as:  (VITAMIN B-12)  Inject 1 mL (1,000 mcg total) into the muscle every 7 (seven) days.     ibuprofen  200 MG tablet  Commonly known as:  ADVIL,MOTRIN  Take 400 mg by mouth every 6 (six) hours as needed for headache, mild pain or moderate pain.           Follow-up Information    Follow up with Dr. French Ana On 08/13/2015.   Why:  1:15 pm       Time coordinating discharge: 35 minutes.  Signed:  RAMA,CHRISTINA  Pager [phone] [facility] 08/06/2015, 10:44 AM

## 2015-08-07 LAB — HEPATITIS PANEL, ACUTE
HCV Ab: <0.1 s/co ratio (ref 0.0–0.9)
HEP A IGM: NEGATIVE
HEP B C IGM: NEGATIVE
HEP B S AG: NEGATIVE

## 2015-08-07 LAB — ANTI-PARIETAL ANTIBODY: PARIETAL CELL ANTIBODY-IGG: 1.9 U (ref 0.0–20.0)

## 2015-08-07 LAB — INTRINSIC FACTOR ANTIBODIES: INTRINSIC FACTOR: 12.6 [AU]/ml — AB (ref 0.0–1.1)

## 2015-08-07 LAB — HAPTOGLOBIN

## 2015-08-08 ENCOUNTER — Other Ambulatory Visit: Payer: Self-pay | Admitting: Oncology

## 2015-08-08 LAB — ROCKY MTN SPOTTED FVR ABS PNL(IGG+IGM)
RMSF IGM: 0.13 {index} (ref 0.00–0.89)
RMSF IgG: UNDETERMINED

## 2015-08-08 LAB — RMSF, IGG, IFA: RMSF, IGG, IFA: 1:64 {titer} — ABNORMAL HIGH

## 2016-06-01 IMAGING — CT CT ABD-PELV W/ CM
2 of 5 series · 17 of 46 positions shown, 19 images · IV contrast (omnipaque)
Comparison: None.

CLINICAL DATA: Nausea, chills and discolored urine developing over
the past 4 weeks since a fall. Shortness of breath. Pancytopenia.

EXAM:
CT ABDOMEN AND PELVIS WITH CONTRAST
TECHNIQUE: Multidetector CT imaging of the abdomen and pelvis was performed
using the standard protocol following bolus administration of
intravenous contrast.
CONTRAST:  100 mL OMNIPAQUE IOHEXOL 300 MG/ML  SOLN

[Series 2: rtn a/p with · axial · 0.86mm/px · z∈[-531,-81]mm · 14 of 102 slices shown, 16 images]
[im 6/102  soft-tissue]
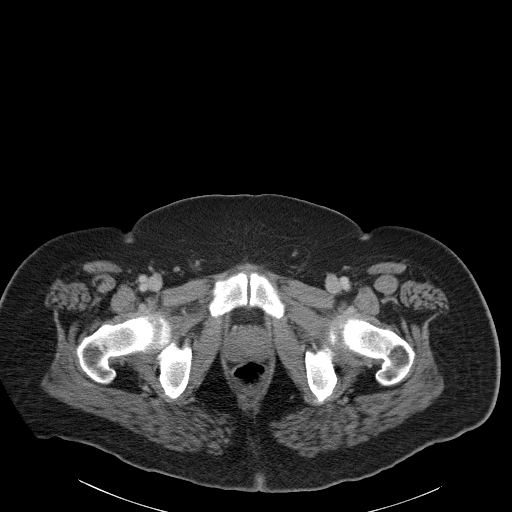
[im 6/102  bone]
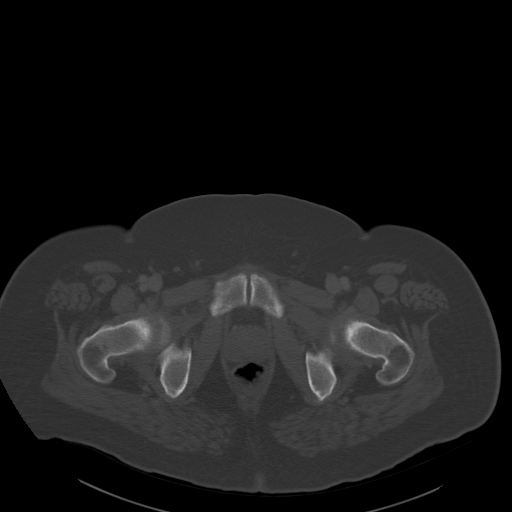
[im 11/102  soft-tissue]
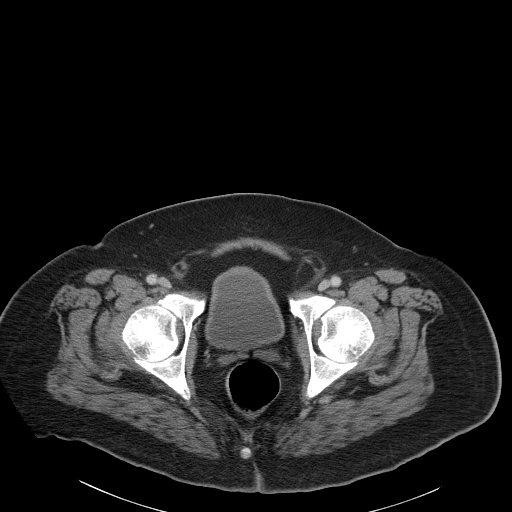
[im 22/102  soft-tissue]
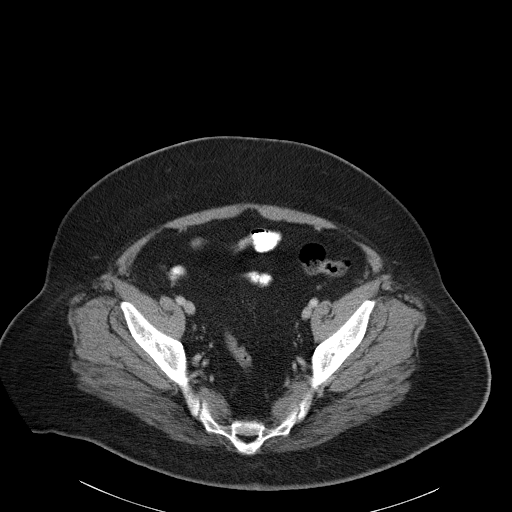
[im 27/102  soft-tissue]
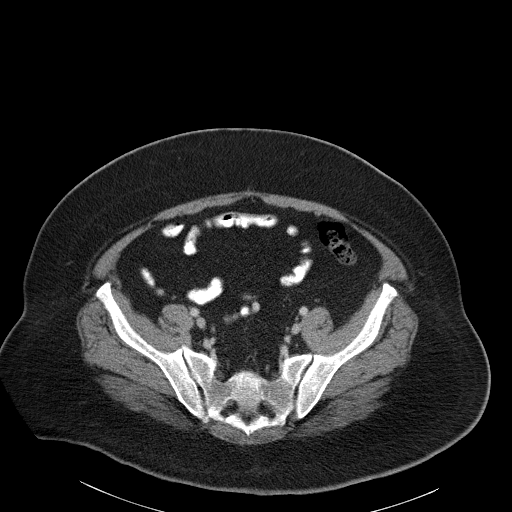
[im 32/102  soft-tissue]
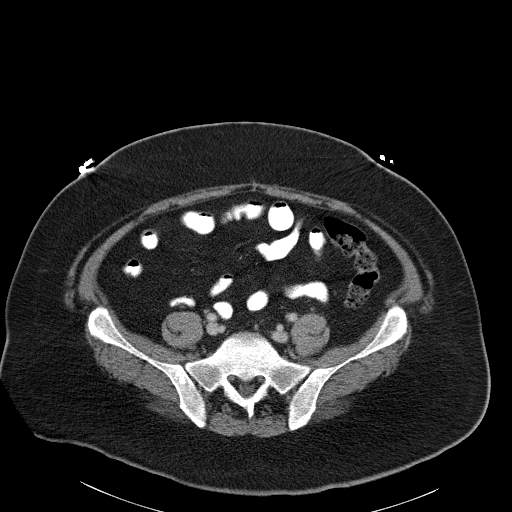
[im 43/102  soft-tissue]
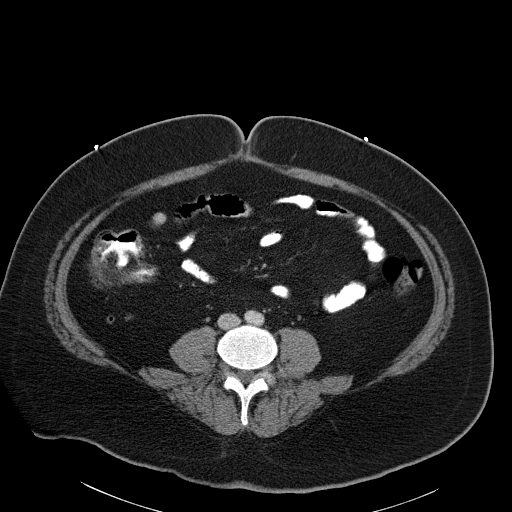
[im 48/102  soft-tissue]
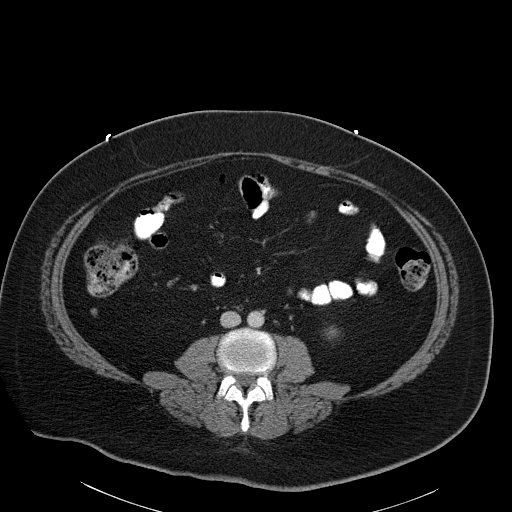
[im 54/102  soft-tissue]
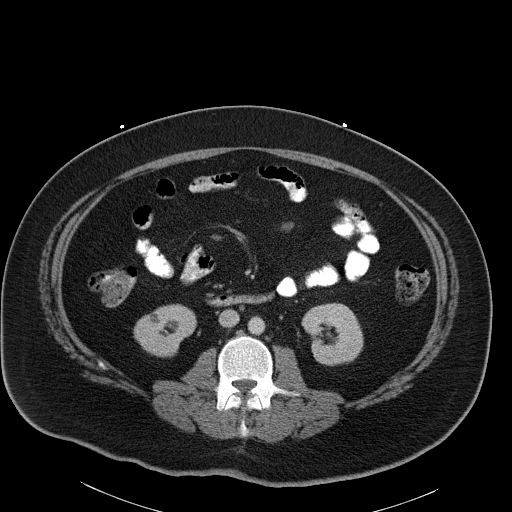
[im 59/102  soft-tissue]
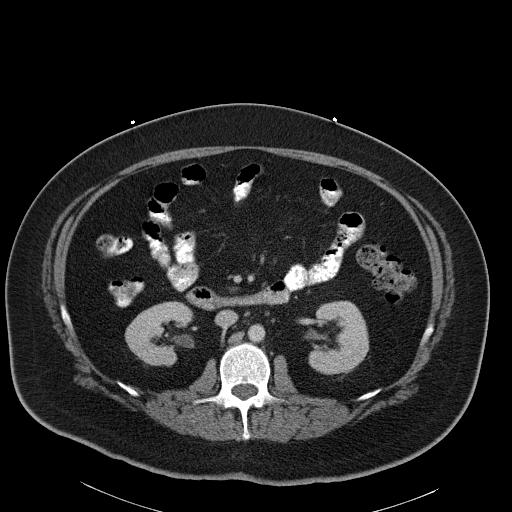
[im 59/102  bone]
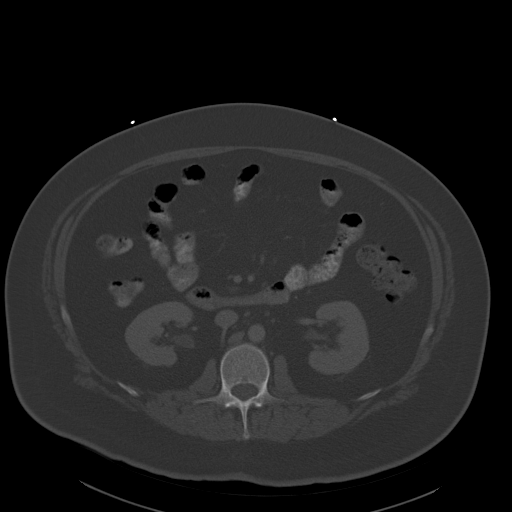
[im 70/102  soft-tissue]
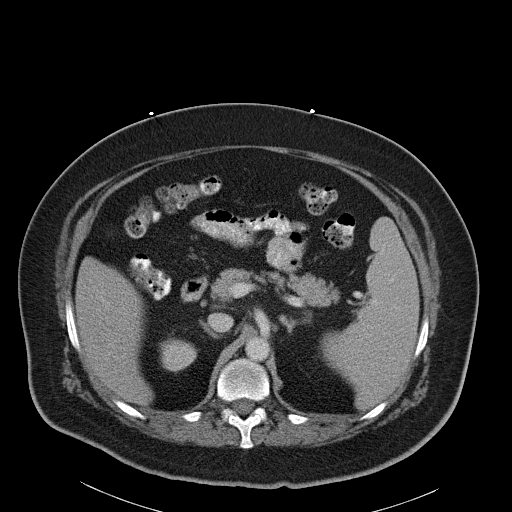
[im 75/102  soft-tissue]
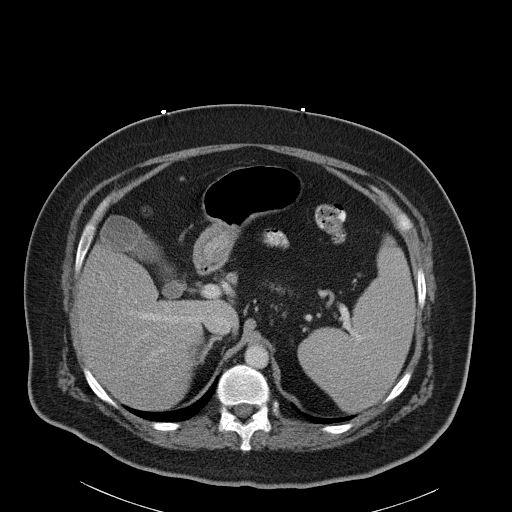
[im 80/102  soft-tissue]
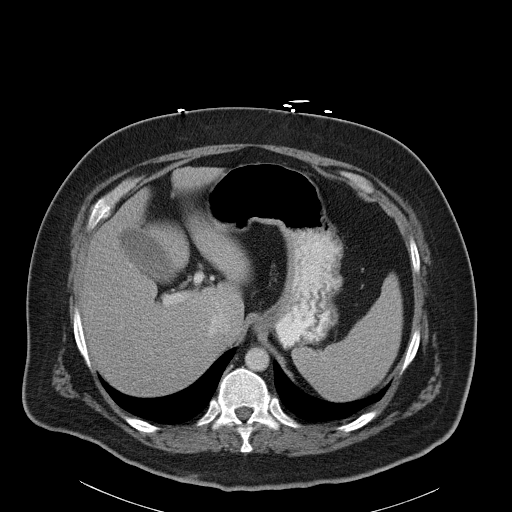
[im 91/102  soft-tissue]
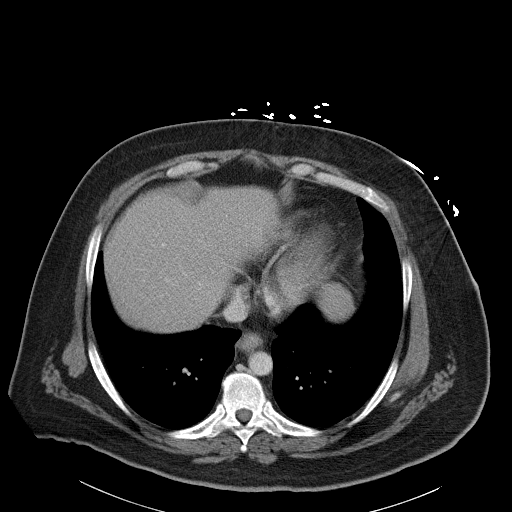
[im 96/102  soft-tissue]
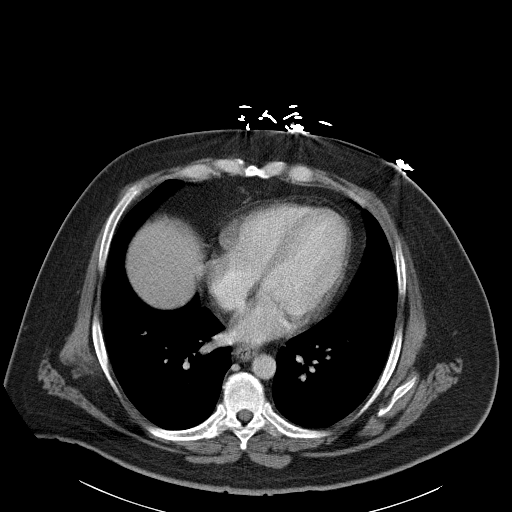

[Series 602: cor · coronal · 1.00mm/px · 3 of 97 slices shown]
[im 33/97  soft-tissue]
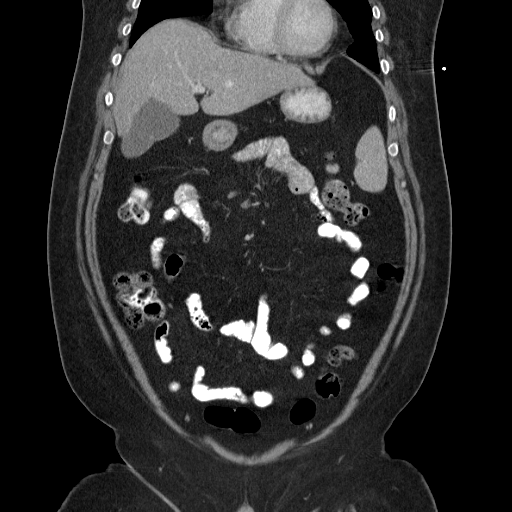
[im 43/97  soft-tissue]
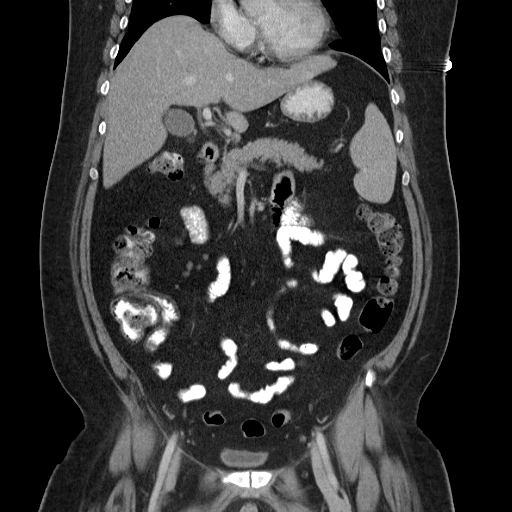
[im 54/97  soft-tissue]
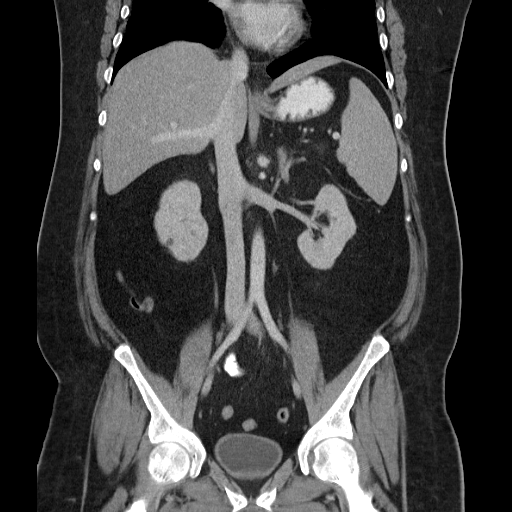

[17 of 46 positions shown; findings below may reference images not displayed]

FINDINGS: The lung bases are clear.  No pleural or pericardial effusion.

The gallbladder, liver, adrenal glands, pancreas and left kidney are
unremarkable. Tiny cyst lower pole right kidney is noted.

The stomach, small and large bowel and appendix appear normal. A
cm gastrohepatic ligament lymph node is identified. This is likely
incidental as no pathologically enlarged lymph nodes are seen.

No focal bony abnormality.
IMPRESSION: No acute abnormality or finding to explain the patient's symptoms.
Single 0.9 cm gastrohepatic ligament lymph node is likely
incidental.
